# Patient Record
Sex: Male | Born: 1955 | Race: Black or African American | Hispanic: No | Marital: Married | Smoking: Former smoker
Health system: Southern US, Community
[De-identification: ages and names within clinical notes are randomized; demographics above are authoritative.]

## PROBLEM LIST (undated history)

## (undated) DIAGNOSIS — Z720 Tobacco use: Secondary | ICD-10-CM

## (undated) DIAGNOSIS — J449 Chronic obstructive pulmonary disease, unspecified: Secondary | ICD-10-CM

## (undated) HISTORY — PX: OTHER SURGICAL HISTORY: SHX169

---

## 2019-03-10 ENCOUNTER — Inpatient Hospital Stay (HOSPITAL_BASED_OUTPATIENT_CLINIC_OR_DEPARTMENT_OTHER)
Admission: EM | Admit: 2019-03-10 | Discharge: 2019-03-14 | DRG: 190 | Disposition: A | Discharge status: Home / Self Care | Payer: Medicare Other | Attending: Internal Medicine | Admitting: Internal Medicine

## 2019-03-10 ENCOUNTER — Encounter (HOSPITAL_BASED_OUTPATIENT_CLINIC_OR_DEPARTMENT_OTHER): Payer: Self-pay | Admitting: Emergency Medicine

## 2019-03-10 ENCOUNTER — Emergency Department (HOSPITAL_BASED_OUTPATIENT_CLINIC_OR_DEPARTMENT_OTHER): Payer: Medicare Other

## 2019-03-10 ENCOUNTER — Other Ambulatory Visit: Payer: Self-pay

## 2019-03-10 DIAGNOSIS — D696 Thrombocytopenia, unspecified: Secondary | ICD-10-CM | POA: Diagnosis present

## 2019-03-10 DIAGNOSIS — Z9981 Dependence on supplemental oxygen: Secondary | ICD-10-CM

## 2019-03-10 DIAGNOSIS — J9621 Acute and chronic respiratory failure with hypoxia: Secondary | ICD-10-CM | POA: Diagnosis present

## 2019-03-10 DIAGNOSIS — Z79899 Other long term (current) drug therapy: Secondary | ICD-10-CM

## 2019-03-10 DIAGNOSIS — Z87891 Personal history of nicotine dependence: Secondary | ICD-10-CM

## 2019-03-10 DIAGNOSIS — J449 Chronic obstructive pulmonary disease, unspecified: Secondary | ICD-10-CM | POA: Diagnosis not present

## 2019-03-10 DIAGNOSIS — F419 Anxiety disorder, unspecified: Secondary | ICD-10-CM | POA: Diagnosis present

## 2019-03-10 DIAGNOSIS — Z20822 Contact with and (suspected) exposure to covid-19: Secondary | ICD-10-CM | POA: Diagnosis present

## 2019-03-10 DIAGNOSIS — M549 Dorsalgia, unspecified: Secondary | ICD-10-CM | POA: Diagnosis present

## 2019-03-10 DIAGNOSIS — Z720 Tobacco use: Secondary | ICD-10-CM | POA: Diagnosis present

## 2019-03-10 DIAGNOSIS — K5900 Constipation, unspecified: Secondary | ICD-10-CM | POA: Diagnosis present

## 2019-03-10 DIAGNOSIS — D539 Nutritional anemia, unspecified: Secondary | ICD-10-CM | POA: Diagnosis present

## 2019-03-10 DIAGNOSIS — Z7951 Long term (current) use of inhaled steroids: Secondary | ICD-10-CM

## 2019-03-10 HISTORY — DX: Tobacco use: Z72.0

## 2019-03-10 HISTORY — DX: Chronic obstructive pulmonary disease, unspecified: J44.9

## 2019-03-10 LAB — CBC WITH DIFFERENTIAL/PLATELET
Abs Immature Granulocytes: 0.01 10*3/uL (ref 0.00–0.07)
Basophils Absolute: 0 10*3/uL (ref 0.0–0.1)
Basophils Relative: 0 %
Eosinophils Absolute: 0.1 10*3/uL (ref 0.0–0.5)
Eosinophils Relative: 2 %
HCT: 38.2 % — ABNORMAL LOW (ref 39.0–52.0)
Hemoglobin: 12 g/dL — ABNORMAL LOW (ref 13.0–17.0)
Immature Granulocytes: 0 %
Lymphocytes Relative: 30 %
Lymphs Abs: 1.3 10*3/uL (ref 0.7–4.0)
MCH: 32.3 pg (ref 26.0–34.0)
MCHC: 31.4 g/dL (ref 30.0–36.0)
MCV: 103 fL — ABNORMAL HIGH (ref 80.0–100.0)
Monocytes Absolute: 0.4 10*3/uL (ref 0.1–1.0)
Monocytes Relative: 9 %
Neutro Abs: 2.5 10*3/uL (ref 1.7–7.7)
Neutrophils Relative %: 59 %
Platelets: 147 10*3/uL — ABNORMAL LOW (ref 150–400)
RBC: 3.71 MIL/uL — ABNORMAL LOW (ref 4.22–5.81)
RDW: 13.1 % (ref 11.5–15.5)
WBC: 4.2 10*3/uL (ref 4.0–10.5)
nRBC: 0 % (ref 0.0–0.2)

## 2019-03-10 LAB — BASIC METABOLIC PANEL
Anion gap: 5 (ref 5–15)
BUN: 19 mg/dL (ref 8–23)
CO2: 37 mmol/L — ABNORMAL HIGH (ref 22–32)
Calcium: 9.2 mg/dL (ref 8.9–10.3)
Chloride: 102 mmol/L (ref 98–111)
Creatinine, Ser: 0.7 mg/dL (ref 0.61–1.24)
GFR calc Af Amer: >60 mL/min (ref >60)
GFR calc non Af Amer: >60 mL/min (ref >60)
Glucose, Bld: 79 mg/dL (ref 70–99)
Potassium: 3.6 mmol/L (ref 3.5–5.1)
Sodium: 144 mmol/L (ref 135–145)

## 2019-03-10 LAB — SARS CORONAVIRUS 2 BY RT PCR (HOSPITAL ORDER, PERFORMED IN ~~LOC~~ HOSPITAL LAB): SARS Coronavirus 2: NEGATIVE

## 2019-03-10 MED ORDER — ACETAMINOPHEN 500 MG PO TABS
1000.0000 mg | ORAL_TABLET | Freq: Once | ORAL | Status: AC
  Administered 2019-03-10: 1000 mg via ORAL
  Filled 2019-03-10: qty 2

## 2019-03-10 MED ORDER — DIAZEPAM 2 MG PO TABS
2.0000 mg | ORAL_TABLET | Freq: Once | ORAL | Status: AC
  Administered 2019-03-10: 2 mg via ORAL
  Filled 2019-03-10: qty 1

## 2019-03-10 MED ORDER — DIAZEPAM 5 MG PO TABS
5.0000 mg | ORAL_TABLET | Freq: Once | ORAL | Status: AC
  Administered 2019-03-10: 5 mg via ORAL
  Filled 2019-03-10: qty 1

## 2019-03-10 NOTE — Plan of Care (Addendum)
64 year old gentleman who originally from [place] with history of severe COPD on 4 L of oxygen at baseline apparently was notified recently that his mother was dying he traveled rapidly to [place] and forgot his home oxygen concentrator he bought some oxygen tanks with him but they ran out he presented to emergency department severely hypoxic where he was started on oxygen.  multiple extensive attempts were done throughout the day to try to arrange for home oxygen concentrator unfortunately they were unsuccessful patient has to be admitted for hypoxia in the setting of running out of home O2. Basic labs unremarkable chest x-ray showing COPD.  Covid currently pending  Accepted as obs  Callan Yontz 8:01 PM

## 2019-03-10 NOTE — ED Notes (Signed)
1600- Spoke to care Production designer, theatre/television/film, she will follow up with patients wife and oxygen company in [place].

## 2019-03-10 NOTE — ED Notes (Signed)
Lean cuisine, applesauce and graham crackers provided.  Encouraged to call for assistance as needed.

## 2019-03-10 NOTE — Progress Notes (Signed)
SATURATION QUALIFICATIONS: (This note is used to comply with regulatory documentation for home oxygen)  Patient Saturations on Room Air at Rest = 80%  Patient Saturations on Room Air while Ambulating = 71 %  Patient Saturations on 2 Liters of oxygen while Ambulating = 93 %  Please briefly explain why patient needs home oxygen: COPD  Per Cleatrice Burke RN

## 2019-03-10 NOTE — ED Notes (Signed)
Pt ate lunch  No distress noted

## 2019-03-10 NOTE — ED Notes (Signed)
1311- Faxed information to [facility] with updated insurance information.  Called [facility], they have not received fax yet.

## 2019-03-10 NOTE — ED Notes (Signed)
1330-Called and spoke to [facility] regarding referral.  They havent received it yet.    1349-Refaxed referral to [facility].  Transmission log shows it went through.    1410-Called [facility] regarding fax.  They havent received referral yet, probably due to staff at lunch.  Informed to wait and call again in one hour.

## 2019-03-10 NOTE — ED Notes (Addendum)
1243-Faxed over information to [facility] for oxygen therapy.  Fax went through ok.

## 2019-03-10 NOTE — ED Notes (Addendum)
Called and spoke to [facility].  They still have not received fax.  Number given for [place]:  [phone].  Called number x 3.  Rings for a minute then hangs up

## 2019-03-10 NOTE — ED Notes (Addendum)
1800-  Numerous attempts have been made to provide an oxygen concentrator at home since 1245 today.  Unable to provide at this time by patients oxygen company until Monday.  Since pt is staying at a hotel, it is not safe to discharge pt on oxygen tanks as they would need to be switched out every 3-4 hours.  Pt is agreeable to admission at this time.  EDP notified.

## 2019-03-10 NOTE — ED Notes (Signed)
Assisted Pt in calling the two phone numbers of family members a couple of times and no one answered. RN Doreatha Martin has been informed of Pt wanting to eat.

## 2019-03-10 NOTE — Progress Notes (Signed)
TOC CM spoke to pt's wife, Steven Kerr. States she contacted pt's DME supplier, [employer] and they are shipping him a portable plug in oxygen so he can travel back to [place]. The device is scheduled to arrive on Monday. I did contact [employer] rep, Chalmers Cater. And they are not able to provide or switch out patient oxygen as he is contracted with [employer]. 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 Wife states pt is in his 2nd year. Isidoro Donning RN CCM, [facility] ED TOC CM [phone]

## 2019-03-10 NOTE — ED Notes (Signed)
1700-  Spoke to care Production designer, theatre/television/film.  Unable to get oxygen concentrator until Monday. Other options discussed.  Unable to provide continuous oxygen at hotel room.

## 2019-03-10 NOTE — ED Notes (Addendum)
SATURATION QUALIFICATIONS: (This note is used to comply with regulatory documentation for home oxygen)  Patient Saturations on Room Air at Rest = 80%  Patient Saturations on Room Air while Ambulating = 71 %  Patient Saturations on 2 Liters of oxygen At rest = 93 %  Please briefly explain why patient needs home oxygen:  COPD Cleatrice Burke RN

## 2019-03-10 NOTE — ED Notes (Signed)
1310-Spoke to wife and gave update

## 2019-03-10 NOTE — ED Notes (Signed)
Pt informed twice that our Charge RN Asencion Islam is speaking with his wife on the phone and she will give him an update after she is done about his oxygen tank.

## 2019-03-10 NOTE — ED Notes (Signed)
[address] [place], [place] [address]

## 2019-03-10 NOTE — ED Triage Notes (Signed)
Here c/o SOB.  Noted to be labored.  Initial O2 sat is 71% on RA.  Reports being here from [place] and he ran out of oxygen.

## 2019-03-10 NOTE — ED Provider Notes (Addendum)
[facility] EMERGENCY DEPARTMENT Provider Note   CSN: [id number] Arrival date & time: 03/10/19  1059     History Chief Complaint  Patient presents with  . Shortness of Breath    Oxygen tank ran out    Steven Kerr is a 64 y.o. male.  HPI Patient reports he had to travel very suddenly last night leaving [place] to come to [place] to see his mother.  She is in the hospital sick and apparently dying.  He reports he left late last night and was unable to accommodate his oxygen tank.  He has run out of oxygen and becomes severely short of breath.  He reports he chronically needs the oxygen for COPD.  He denies that he has been having any problems with fevers, chills, productive cough or chest pain.  Denies has been having any issues with swelling of the legs.  He reports he is also having anxiety because of the stressful situation with his mother and some diffuse central back pain from lying in the stretcher.  He otherwise denies any immediate complaints.  He is eating without difficulty.  No GI or GU symptoms.    Past Medical History:  Diagnosis Date  . COPD (chronic obstructive pulmonary disease) (HCC)     There are no problems to display for this patient.   History reviewed. No pertinent surgical history.     No family history on file.  Social History   Tobacco Use  . Smoking status: Former Games developer  . Smokeless tobacco: Never Used  Substance Use Topics  . Alcohol use: Never  . Drug use: Never    Home Medications Prior to Admission medications   Medication Sig Start Date End Date Taking? Authorizing Provider  albuterol (VENTOLIN HFA) 108 (90 Base) MCG/ACT inhaler Inhale 2 puffs into the lungs every 6 (six) hours as needed for wheezing or shortness of breath.   Yes [provider]  OXYGEN Inhale 2 L/min into the lungs continuous.   Yes [provider]  tiotropium (SPIRIVA) 18 MCG inhalation capsule Place 18 mcg into inhaler and inhale daily.    Yes [provider]    Allergies    Patient has no known allergies.  Review of Systems   Review of Systems 10 Systems reviewed and are negative for acute change except as noted in the HPI.  Physical Exam Updated Vital Signs BP 118/76   Pulse (!) 109   Temp 97.7 F (36.5 C) (Oral)   Resp (!) 27   Ht 6\' 1"  (1.854 m)   Wt 61.2 kg   SpO2 97%   BMI 17.81 kg/m   Physical Exam Constitutional:      Comments: Patient is very thin.  He is on nasal cannula oxygen.  No respiratory distress.  Mental status clear.  Sitting eating a meal tray without respiratory distress.  HENT:     Head: Normocephalic and atraumatic.  Eyes:     Extraocular Movements: Extraocular movements intact.  Cardiovascular:     Rate and Rhythm: Normal rate and regular rhythm.  Pulmonary:     Comments: No respiratory distress at rest.  Breath sounds are very soft throughout the lung fields. Abdominal:     General: There is no distension.     Palpations: Abdomen is soft.     Tenderness: There is no abdominal tenderness.  Musculoskeletal:        General: No swelling or tenderness. Normal range of motion.  Skin:    General:  Skin is warm and dry.  Neurological:     General: No focal deficit present.     Mental Status: He is oriented to person, place, and time.     Coordination: Coordination normal.     ED Results / Procedures / Treatments   Labs (all labs ordered are listed, but only abnormal results are displayed) Labs Reviewed - No data to display  EKG EKG Interpretation  Date/Time:  Friday March 10 2019 11:11:06 EST Ventricular Rate:  114 PR Interval:    QRS Duration: 78 QT Interval:  326 QTC Calculation: 453 R Axis:   84 Text Interpretation: Sinus tachycardia Atrial premature complex Anterior infarct, old Nonspecific T abnormalities, lateral leads agree, no STEMI, no old comparison Confirmed by Charlesetta Shanks [phone]) on 03/10/2019 2:21:30 PM   Radiology No results  found.  Procedures Procedures (including critical care time)  Medications Ordered in ED Medications  diazepam (VALIUM) tablet 2 mg (2 mg Oral Given 03/10/19 1253)  acetaminophen (TYLENOL) tablet 1,000 mg (1,000 mg Oral Given 03/10/19 1253)    ED Course  I have reviewed the triage vital signs and the nursing notes.  Pertinent labs & imaging results that were available during my care of the patient were reviewed by me and considered in my medical decision making (see chart for details).  Clinical Course as of Mar 12 1706  Fri Mar 10, 2019  1534 Patient signed out to me by Dr. Christia Reading.  Briefly 64 yo male w/ hx of COPD on 4L [place] at baseline presenting to ED because his portable oxygen tanks ran out.  He is visiting here from [place] because his mother is in the hospital on hospice.  He brought a few portable tanks but not his oxygen condenser machine and his tanks ran out this morning.  He was initially hypoxic on arrival but this corrected on his baseline [place] amount.  No other acute clinical concerns.  We are attempting to find him replacement oxygen.   [MT]  6720 Patient's O2 company contacted and they will be able to ship him new supplies on Monday.  Unfortunately we have no available option to provide him long-term home O2 from the emergency department.  He will need observation admission for oxygen   [MT]  1958 To admit to hospitalist, Dr Roel Cluck   [MT]    Clinical Course User Index [MT] Langston Masker Carola Rhine, MD   MDM Rules/Calculators/A&P                     Patient has no acute complaints however he has run out of oxygen on which he is dependent.  He is denying fever, cough, atypical shortness of breath or chest pain.  He was unable to bring adequate oxygen due to transportation issues coming from [place].  He also reports he needs some Valium because he is very upset about his mother's condition while he has to be waiting in the emergency department to get more oxygen before he can be with  his family.  We are trying to get the patient the oxygen therapy on outpatient basis that he needs.  He has been provided with a meal tray, supplemental oxygen, Tylenol and Valium.  He is comfortable in appearance and awaiting means to obtain adequate oxygen refills while here in [place]. Final Clinical Impression(s) / ED Diagnoses Final diagnoses:  COPD without exacerbation ([place])  Oxygen dependent    Rx / DC Orders ED Discharge Orders    None  Arby Barrette, MD 03/10/19 1425    Arby Barrette, MD 03/13/19 [phone]

## 2019-03-10 NOTE — ED Notes (Signed)
Pt on monitor 

## 2019-03-11 ENCOUNTER — Encounter (HOSPITAL_COMMUNITY): Payer: Self-pay | Admitting: Internal Medicine

## 2019-03-11 DIAGNOSIS — F419 Anxiety disorder, unspecified: Secondary | ICD-10-CM | POA: Diagnosis present

## 2019-03-11 DIAGNOSIS — M549 Dorsalgia, unspecified: Secondary | ICD-10-CM | POA: Diagnosis present

## 2019-03-11 DIAGNOSIS — J9621 Acute and chronic respiratory failure with hypoxia: Secondary | ICD-10-CM

## 2019-03-11 DIAGNOSIS — D696 Thrombocytopenia, unspecified: Secondary | ICD-10-CM | POA: Diagnosis present

## 2019-03-11 DIAGNOSIS — J449 Chronic obstructive pulmonary disease, unspecified: Secondary | ICD-10-CM | POA: Diagnosis present

## 2019-03-11 DIAGNOSIS — D539 Nutritional anemia, unspecified: Secondary | ICD-10-CM | POA: Diagnosis present

## 2019-03-11 DIAGNOSIS — Z9981 Dependence on supplemental oxygen: Secondary | ICD-10-CM | POA: Diagnosis present

## 2019-03-11 DIAGNOSIS — Z720 Tobacco use: Secondary | ICD-10-CM

## 2019-03-11 DIAGNOSIS — Z7951 Long term (current) use of inhaled steroids: Secondary | ICD-10-CM | POA: Diagnosis not present

## 2019-03-11 DIAGNOSIS — Z20822 Contact with and (suspected) exposure to covid-19: Secondary | ICD-10-CM | POA: Diagnosis present

## 2019-03-11 DIAGNOSIS — K5900 Constipation, unspecified: Secondary | ICD-10-CM | POA: Diagnosis present

## 2019-03-11 DIAGNOSIS — Z87891 Personal history of nicotine dependence: Secondary | ICD-10-CM | POA: Diagnosis not present

## 2019-03-11 DIAGNOSIS — Z79899 Other long term (current) drug therapy: Secondary | ICD-10-CM | POA: Diagnosis not present

## 2019-03-11 LAB — COMPREHENSIVE METABOLIC PANEL WITH GFR
ALT: 26 U/L (ref 0–44)
AST: 30 U/L (ref 15–41)
Albumin: 3.9 g/dL (ref 3.5–5.0)
Alkaline Phosphatase: 58 U/L (ref 38–126)
Anion gap: 8 (ref 5–15)
BUN: 13 mg/dL (ref 8–23)
CO2: 33 mmol/L — ABNORMAL HIGH (ref 22–32)
Calcium: 9.1 mg/dL (ref 8.9–10.3)
Chloride: 102 mmol/L (ref 98–111)
Creatinine, Ser: 0.85 mg/dL (ref 0.61–1.24)
GFR calc Af Amer: >60 mL/min
GFR calc non Af Amer: >60 mL/min
Glucose, Bld: 116 mg/dL — ABNORMAL HIGH (ref 70–99)
Potassium: 4.1 mmol/L (ref 3.5–5.1)
Sodium: 143 mmol/L (ref 135–145)
Total Bilirubin: 1 mg/dL (ref 0.3–1.2)
Total Protein: 6.4 g/dL — ABNORMAL LOW (ref 6.5–8.1)

## 2019-03-11 LAB — HIV ANTIBODY (ROUTINE TESTING W REFLEX): HIV Screen 4th Generation wRfx: NONREACTIVE

## 2019-03-11 LAB — VITAMIN B12: Vitamin B-12: 826 pg/mL (ref 180–914)

## 2019-03-11 MED ORDER — DIAZEPAM 5 MG PO TABS
5.0000 mg | ORAL_TABLET | Freq: Two times a day (BID) | ORAL | Status: DC | PRN
  Administered 2019-03-11 – 2019-03-12 (×4): 5 mg via ORAL
  Filled 2019-03-11 (×4): qty 1

## 2019-03-11 MED ORDER — BISACODYL 5 MG PO TBEC
5.0000 mg | DELAYED_RELEASE_TABLET | Freq: Every day | ORAL | Status: DC | PRN
  Administered 2019-03-12 – 2019-03-14 (×3): 5 mg via ORAL
  Filled 2019-03-11 (×3): qty 1

## 2019-03-11 MED ORDER — ACETAMINOPHEN 650 MG RE SUPP: 650.0000 mg | Freq: Four times a day (QID) | RECTAL | Status: DC | PRN

## 2019-03-11 MED ORDER — ONDANSETRON HCL 4 MG/2ML IJ SOLN: 4.0000 mg | Freq: Four times a day (QID) | INTRAMUSCULAR | Status: DC | PRN

## 2019-03-11 MED ORDER — NICOTINE 21 MG/24HR TD PT24
21.0000 mg | MEDICATED_PATCH | Freq: Every day | TRANSDERMAL | Status: DC
  Administered 2019-03-11 – 2019-03-14 (×4): 21 mg via TRANSDERMAL
  Filled 2019-03-11 (×4): qty 1

## 2019-03-11 MED ORDER — ALBUTEROL SULFATE (2.5 MG/3ML) 0.083% IN NEBU
2.5000 mg | INHALATION_SOLUTION | Freq: Four times a day (QID) | RESPIRATORY_TRACT | Status: DC | PRN
  Administered 2019-03-11: 2.5 mg via RESPIRATORY_TRACT
  Filled 2019-03-11 (×2): qty 3

## 2019-03-11 MED ORDER — ONDANSETRON HCL 4 MG PO TABS: 4.0000 mg | ORAL_TABLET | Freq: Four times a day (QID) | ORAL | Status: DC | PRN

## 2019-03-11 MED ORDER — WHITE PETROLATUM EX OINT
TOPICAL_OINTMENT | CUTANEOUS | Status: AC
  Filled 2019-03-11: qty 28.35

## 2019-03-11 MED ORDER — KETOROLAC TROMETHAMINE 30 MG/ML IJ SOLN
30.0000 mg | Freq: Four times a day (QID) | INTRAMUSCULAR | Status: DC | PRN
  Administered 2019-03-11 – 2019-03-13 (×6): 30 mg via INTRAVENOUS
  Filled 2019-03-11 (×7): qty 1

## 2019-03-11 MED ORDER — ACETAMINOPHEN 325 MG PO TABS
650.0000 mg | ORAL_TABLET | Freq: Four times a day (QID) | ORAL | Status: DC | PRN
  Administered 2019-03-11 – 2019-03-14 (×6): 650 mg via ORAL
  Filled 2019-03-11 (×6): qty 2

## 2019-03-11 MED ORDER — UMECLIDINIUM BROMIDE 62.5 MCG/INH IN AEPB
1.0000 | INHALATION_SPRAY | Freq: Every day | RESPIRATORY_TRACT | Status: DC
  Administered 2019-03-11 – 2019-03-14 (×4): 1 via RESPIRATORY_TRACT
  Filled 2019-03-11 (×2): qty 7

## 2019-03-11 MED ORDER — TIOTROPIUM BROMIDE MONOHYDRATE 18 MCG IN CAPS: 18.0000 ug | ORAL_CAPSULE | Freq: Every day | RESPIRATORY_TRACT | Status: DC

## 2019-03-11 MED ORDER — DIAZEPAM 5 MG PO TABS: 5.0000 mg | ORAL_TABLET | Freq: Two times a day (BID) | ORAL | Status: DC

## 2019-03-11 MED ORDER — ENOXAPARIN SODIUM 40 MG/0.4ML ~~LOC~~ SOLN
40.0000 mg | SUBCUTANEOUS | Status: DC
  Administered 2019-03-11 – 2019-03-14 (×4): 40 mg via SUBCUTANEOUS
  Filled 2019-03-11 (×4): qty 0.4

## 2019-03-11 MED ORDER — TRAZODONE HCL 50 MG PO TABS
50.0000 mg | ORAL_TABLET | Freq: Once | ORAL | Status: AC
  Administered 2019-03-11: 50 mg via ORAL
  Filled 2019-03-11: qty 1

## 2019-03-11 NOTE — Progress Notes (Signed)
[facility]. Alert,oriented x4. O2 via nasal cannula @2L /min . No signs of distress

## 2019-03-11 NOTE — Progress Notes (Signed)
Pt is here from [place] to see his dying mother who happens to be in [phone]!  He ran out of oxygen in his tank and went to [facility].  He was transferred here to 6N04 with [facility].  I wheeled him over to see his mom. Pt hasnt eaten for 20 hours so ordered meals for him.

## 2019-03-11 NOTE — ED Notes (Signed)
ED TO INPATIENT HANDOFF REPORT  ED Nurse Name and Phone #: Normajean Baxter [phone]  S Name/Age/Gender Steven Kerr 64 y.o. male Room/Bed: MH12/MH12  Code Status   Code Status: Not on file  Home/SNF/Other Home Patient oriented to: self, place, time and situation Is this baseline? Yes   Triage Complete: Triage complete  Chief Complaint Hypoxia [R09.02]  Triage Note Here c/o SOB.  Noted to be labored.  Initial O2 sat is 71% on RA.  Reports being here from [place] and he ran out of oxygen.    Allergies No Known Allergies  Level of Care/Admitting Diagnosis ED Disposition    ED Disposition Condition Comment   Admit  Hospital Area: [place] [[id number]]  Level of Care: Med-Surg [16]  I expect the patient will be discharged within 24 hours: No (not a candidate for 5C-Observation unit)  Covid Evaluation: Confirmed COVID Negative  Date Laboratory Confirmed COVID Negative: 03/10/2019  Diagnosis: Hypoxia [010272]  Admitting Physician: [place], [place]  Attending Physician: Toy Baker [[id number]]       B Medical/Surgery History Past Medical History:  Diagnosis Date  . COPD (chronic obstructive pulmonary disease) ([place])    History reviewed. No pertinent surgical history.   A IV Location/Drains/Wounds Patient Lines/Drains/Airways Status   Active Line/Drains/Airways    Name:   Placement date:   Placement time:   Site:   Days:   Peripheral IV 03/10/19 Right Antecubital   03/10/19    1859    Antecubital   1          Intake/Output Last 24 hours No intake or output data in the 24 hours ending 03/11/19 0725  Labs/Imaging Results for orders placed or performed during the hospital encounter of 03/10/19 (from the past 48 hour(s))  SARS Coronavirus 2 by RT PCR (hospital order, performed in [place] hospital lab) Nasopharyngeal Nasopharyngeal Swab     Status: None   Collection Time: 03/10/19  6:59 PM   Specimen: Nasopharyngeal Swab  Result Value Ref  Range   SARS Coronavirus 2 NEGATIVE NEGATIVE    Comment: Performed at [facility], [address] [place], [place] [address]  CBC with Differential     Status: Abnormal   Collection Time: 03/10/19  6:59 PM  Result Value Ref Range   WBC 4.2 4.0 - 10.5 K/uL   RBC 3.71 (L) 4.22 - 5.81 MIL/uL   Hemoglobin 12.0 (L) 13.0 - 17.0 g/dL   HCT 38.2 (L) 39.0 - 52.0 %   MCV 103.0 (H) 80.0 - 100.0 fL   MCH 32.3 26.0 - 34.0 pg   MCHC 31.4 30.0 - 36.0 g/dL   RDW 13.1 11.5 - 15.5 %   Platelets 147 (L) 150 - 400 K/uL   nRBC 0.0 0.0 - 0.2 %   Neutrophils Relative % 59 %   Neutro Abs 2.5 1.7 - 7.7 K/uL   Lymphocytes Relative 30 %   Lymphs Abs 1.3 0.7 - 4.0 K/uL   Monocytes Relative 9 %   Monocytes Absolute 0.4 0.1 - 1.0 K/uL   Eosinophils Relative 2 %   Eosinophils Absolute 0.1 0.0 - 0.5 K/uL   Basophils Relative 0 %   Basophils Absolute 0.0 0.0 - 0.1 K/uL   Immature Granulocytes 0 %   Abs Immature Granulocytes 0.01 0.00 - 0.07 K/uL    Comment: Performed at [facility], [address] [place], [place] [address]  Basic metabolic panel     Status: Abnormal  Collection Time: 03/10/19  6:59 PM  Result Value Ref Range   Sodium 144 135 - 145 mmol/L   Potassium 3.6 3.5 - 5.1 mmol/L   Chloride 102 98 - 111 mmol/L   CO2 37 (H) 22 - 32 mmol/L   Glucose, Bld 79 70 - 99 mg/dL   BUN 19 8 - 23 mg/dL   Creatinine, Ser 6.43 0.61 - 1.24 mg/dL   Calcium 9.2 8.9 - 32.9 mg/dL   GFR calc non Af Amer >60 >60 mL/min   GFR calc Af Amer >60 >60 mL/min   Anion gap 5 5 - 15    Comment: Performed at [facility], [address] [place], [place] [address]   DG Chest Portable 1 View  Result Date: 03/10/2019 CLINICAL DATA:  64 year old male with shortness of breath. EXAM: PORTABLE CHEST 1 VIEW COMPARISON:  None. FINDINGS: There is severe emphysema with hyperexpansion of the lungs. No focal consolidation, pleural effusion, or pneumothorax. The cardiac silhouette is within normal  limits. No acute osseous pathology. IMPRESSION: 1. No acute cardiopulmonary process. 2. Severe emphysema. Electronically Signed   By: Elgie Collard M.D.   On: 03/10/2019 18:21    Pending Labs Unresulted Labs (From admission, onward)   None      Vitals/Pain Today's Vitals   03/11/19 0500 03/11/19 0530 03/11/19 0545 03/11/19 0600  BP: 105/74 110/85  139/87  Pulse: 84 85 85   Resp: (!) 21 18 (!) 22 (!) 22  Temp:      TempSrc:      SpO2: 100% 100% 96%   Weight:      Height:      PainSc: Asleep   Asleep    Isolation Precautions No active isolations  Medications Medications  diazepam (VALIUM) tablet 2 mg (2 mg Oral Given 03/10/19 1253)  acetaminophen (TYLENOL) tablet 1,000 mg (1,000 mg Oral Given 03/10/19 1253)  diazepam (VALIUM) tablet 2 mg (2 mg Oral Given 03/10/19 1851)  diazepam (VALIUM) tablet 5 mg (5 mg Oral Given 03/10/19 2202)    Mobility walks Low fall risk   Focused Assessments .   R Recommendations: See Admitting Provider Note  Report given to:   Additional Notes:

## 2019-03-11 NOTE — H&P (Addendum)
History and Physical    Steven Kerr LGX:[id number] DOB: November 05, 1955 DOA: 03/10/2019  Referring MD/NP/PA: Leonie Man, MD PCP: System, Pcp Not In  Patient coming from: Transfer from [facility]  Chief Complaint: Shortness of breath  I have personally briefly reviewed patient's old medical records in [place]   HPI: Steven Kerr is a 64 y.o. male with medical history significant of COPD on 2 L of oxygen at baseline and tobacco abuse.  He presents with complaints of shortness of breath.  Patient had traveled from [place] here after being told that his mother was dying.  He was unable to bring enough oxygen canisters with him and ran out of his medications like Spiriva and Valium.  Associated symptoms include anxiousness.  If everything is going on he has been hyperventilating and requiring 4 L of nasal cannula oxygen to maintain his oxygenation.  Denies having any significant fever, cough, chest pain, nausea, vomiting, or diarrhea.  Patient smoked for more than 20 years, but reported just quitting last month.  ED Course: Upon admission to the emergency department patient was noted to be afebrile, tachycardic, and tachypneic with O2 saturations as low as 71% on room air.  O2 saturations improved once placed back on home 4 L.  Labs significant for hemoglobin 12, platelet count 147, and CO2 37.  COVID-19 screening was negative.  Chest x-ray noted severe emphysema without signs of infiltrate.  Patient was given Valium and Tylenol.  [facility] called to admit as they were unable to set the patient up with home oxygen at med center.  Review of Systems  Constitutional: Negative for fever and weight loss.  HENT: Negative for ear pain.   Eyes: Negative for discharge.  Respiratory: Positive for shortness of breath. Negative for cough.   Cardiovascular: Negative for chest pain, leg swelling and PND.  Gastrointestinal: Negative for abdominal pain, nausea and vomiting.  Genitourinary: Negative for dysuria and  hematuria.  Musculoskeletal: Negative for joint pain and myalgias.  Skin: Negative for itching.  Neurological: Negative for focal weakness and loss of consciousness.  Psychiatric/Behavioral: Negative for suicidal ideas. The patient is nervous/anxious.     Past Medical History:  Diagnosis Date  . COPD (chronic obstructive pulmonary disease) ([place])   . Tobacco abuse     Past Surgical History:  Procedure Laterality Date  . Multiple back surgery       reports that he has quit smoking. He has never used smokeless tobacco. He reports that he does not drink alcohol or use drugs.  No Known Allergies  Family History  Problem Relation Age of Onset  . Hypertension Mother   . Hyperlipidemia Mother   . Dementia Mother     Prior to Admission medications   Medication Sig Start Date End Date Taking? Authorizing Provider  albuterol (VENTOLIN HFA) 108 (90 Base) MCG/ACT inhaler Inhale 2 puffs into the lungs every 6 (six) hours as needed for wheezing or shortness of breath.   Yes [provider]  OXYGEN Inhale 2 L/min into the lungs continuous.   Yes [provider]  tiotropium (SPIRIVA) 18 MCG inhalation capsule Place 18 mcg into inhaler and inhale daily.   Yes [provider]    Physical Exam:  Constitutional: Thin elderly appearing male with sitting in a wheelchair in his mother room across the hall from his room Vitals:   03/11/19 0545 03/11/19 0600 03/11/19 0730 03/11/19 0836  BP:  139/87 126/88 (!) 141/101  Pulse: 85  86 95  Resp: Marland Kitchen)  22 (!) 22 (!) 22 15  Temp:    (!) 97.5 F (36.4 C)  TempSrc:    Oral  SpO2: 96%  97% 100%  Weight:    59 kg  Height:    6\' 1"  (1.854 m)   Eyes: PERRL, lids and conjunctivae normal ENMT: Mucous membranes are moist. Posterior pharynx clear of any exudate or lesions.   Neck: normal, supple, no masses, no thyromegaly Respiratory: Decreased overall aeration without expiratory wheeze appreciated.  Currently on nasal cannula  oxygen at 2 L on nasal cannula oxygen able to can talk in complete sentences. Cardiovascular: Regular rate and rhythm, no murmurs / rubs / gallops. No extremity edema. 2+ pedal pulses. No carotid bruits.  Abdomen: no tenderness, no masses palpated. No hepatosplenomegaly. Bowel sounds positive.  Musculoskeletal: no clubbing / cyanosis. No joint deformity upper and lower extremities. Good ROM, no contractures. Normal muscle tone.  Skin: no rashes, lesions, ulcers. No induration Neurologic: CN 2-12 grossly intact. Sensation intact, DTR normal. Strength 5/5 in all 4.  Psychiatric: Normal judgment and insight. Alert and oriented x 3.  Anxious mood.     Labs on Admission: I have personally reviewed following labs and imaging studies  CBC: Recent Labs  Lab 03/10/19 1859  WBC 4.2  NEUTROABS 2.5  HGB 12.0*  HCT 38.2*  MCV 103.0*  PLT 147*   Basic Metabolic Panel: Recent Labs  Lab 03/10/19 1859  NA 144  K 3.6  CL 102  CO2 37*  GLUCOSE 79  BUN 19  CREATININE 0.70  CALCIUM 9.2   GFR: Estimated Creatinine Clearance: 79.9 mL/min (by C-G formula based on SCr of 0.7 mg/dL). Liver Function Tests: No results for input(s): AST, ALT, ALKPHOS, BILITOT, PROT, ALBUMIN in the last 168 hours. No results for input(s): LIPASE, AMYLASE in the last 168 hours. No results for input(s): AMMONIA in the last 168 hours. Coagulation Profile: No results for input(s): INR, PROTIME in the last 168 hours. Cardiac Enzymes: No results for input(s): CKTOTAL, CKMB, CKMBINDEX, TROPONINI in the last 168 hours. BNP (last 3 results) No results for input(s): PROBNP in the last 8760 hours. HbA1C: No results for input(s): HGBA1C in the last 72 hours. CBG: No results for input(s): GLUCAP in the last 168 hours. Lipid Profile: No results for input(s): CHOL, HDL, LDLCALC, TRIG, CHOLHDL, LDLDIRECT in the last 72 hours. Thyroid Function Tests: No results for input(s): TSH, T4TOTAL, FREET4, T3FREE, THYROIDAB in the  last 72 hours. Anemia Panel: No results for input(s): VITAMINB12, FOLATE, FERRITIN, TIBC, IRON, RETICCTPCT in the last 72 hours. Urine analysis: No results found for: COLORURINE, APPEARANCEUR, LABSPEC, PHURINE, GLUCOSEU, HGBUR, BILIRUBINUR, KETONESUR, PROTEINUR, UROBILINOGEN, NITRITE, LEUKOCYTESUR Sepsis Labs: Recent Results (from the past 240 hour(s))  SARS Coronavirus 2 by RT PCR (hospital order, performed in [facility] hospital lab) Nasopharyngeal Nasopharyngeal Swab     Status: None   Collection Time: 03/10/19  6:59 PM   Specimen: Nasopharyngeal Swab  Result Value Ref Range Status   SARS Coronavirus 2 NEGATIVE NEGATIVE Final    Comment: Performed at [facility], [address] [place], [place]     Radiological Exams on Admission: DG Chest Portable 1 View  Result Date: 03/10/2019 CLINICAL DATA:  64 year old male with shortness of breath. EXAM: PORTABLE CHEST 1 VIEW COMPARISON:  None. FINDINGS: There is severe emphysema with hyperexpansion of the lungs. No focal consolidation, pleural effusion, or pneumothorax. The cardiac silhouette is within normal limits. No acute osseous pathology. IMPRESSION: 1.  No acute cardiopulmonary process. 2. Severe emphysema. Electronically Signed   By: Elgie Collard M.D.   On: 03/10/2019 18:21    EKG: Independently reviewed.  Sinus tachycardia 114 bpm  Assessment/Plan Acute on chronic chronic respiratory failure with hypoxia, COPD/emphysema: Patient presents with complaints of shortness of breath after running out of oxygen while traveling from [place].  Normally patient on 2 L clean.  Chest x-ray noted severe emphysema.  Patient has poor overall air movement, but does not appear to be having an acute flare.  Social work consulted and noted that it will likely be 1/25 before able to arrange for oxygen to be delivered to patient. -Admit to a MedSurg bed -Continuous nasal cannula oxygen to maintain O2 saturation -Transitions of care  consult for oxygen  -Continue pharmacy substitution of Spiriva and albuterol inhaler as needed  Macrocytic anemia: Hemoglobin 12 on admission with elevated MCV of 103.  Question possibility of nutrient deficiency. -Check vitamin B12 and folate  Thrombocytopenia: Acute.  Platelet count 147 on admission. -Check CMP  Anxiety: Patient reports being very anxious with everything that is going on with his mother currently.  Reports taking Valium twice daily at home as needed for anxiety. -Continue Valium as needed twice daily  Tobacco abuse: Patient reports quitting smoking tobacco approximately 1 month ago. -Continue to encourage cessation of tobacco use  DVT prophylaxis: Lovenox Code Status: Full Family Communication: No family requested to be updated Disposition Plan: Likely discharge home in 1 day. Consults called: None Admission status: Inpatient   Clydie Braun MD [facility] Pager [phone]   If 7PM-7AM, please contact night-coverage [email or web address] Password [email or web address]  03/11/2019, 10:50 AM

## 2019-03-12 DIAGNOSIS — J449 Chronic obstructive pulmonary disease, unspecified: Secondary | ICD-10-CM

## 2019-03-12 DIAGNOSIS — Z9981 Dependence on supplemental oxygen: Secondary | ICD-10-CM

## 2019-03-12 MED ORDER — TRAZODONE HCL 50 MG PO TABS
50.0000 mg | ORAL_TABLET | Freq: Every evening | ORAL | Status: DC | PRN
  Administered 2019-03-12 – 2019-03-13 (×2): 50 mg via ORAL
  Filled 2019-03-12 (×2): qty 1

## 2019-03-12 NOTE — Plan of Care (Signed)

## 2019-03-12 NOTE — Progress Notes (Signed)
PROGRESS NOTE    Steven Kerr  EGB:[id number] DOB: [birth year] DOA: 03/10/2019 PCP: System, Pcp Not In   Brief Narrative:   Steven Kerr is a 64 y.o. male with medical history significant of COPD on 2 L of oxygen at baseline and tobacco abuse.  He presents with complaints of shortness of breath.  Patient had traveled from [place] here after being told that his mother was dying.  He was unable to bring enough oxygen canisters with him and ran out of his medications like Spiriva and Valium.  Upon admission to the emergency department patient was noted to be afebrile, tachycardic, and tachypneic with O2 saturations as low as 71% on room air.  O2 saturations improved once placed back on home 4 L.  Labs significant for hemoglobin 12, platelet count 147, and CO2 37.  COVID-19 screening was negative.  Chest x-ray noted severe emphysema without signs of infiltrate.  Patient was given Valium and Tylenol.  [facility] called to admit as they were unable to set the patient up with home oxygen at med center.   Assessment & Plan:   Principal Problem:   Acute on chronic respiratory failure with hypoxia (HCC) Active Problems:   Macrocytic anemia   Thrombocytopenia (HCC)   Anxiety   Tobacco abuse  Acute on chronic chronic respiratory failure with hypoxia, COPD/emphysema: Patient presents with complaints of shortness of breath after running out of oxygen while traveling from [place].  Normally patient on 2 L clean.  Chest x-ray noted severe emphysema.  Patient has poor overall air movement, but does not appear to be having an acute flare.  Social work consulted and noted that it will likely be 1/25 before able to arrange for oxygen to be delivered to patient. -Continuous nasal cannula oxygen to maintain O2 saturation; back at home supplemental O2 level  -Transitions of care consult for oxygen  -Continue pharmacy substitution of Spiriva and albuterol inhaler as needed  Macrocytic anemia: Hemoglobin 12 on admission  with elevated MCV of 103.  Question possibility of nutrient deficiency. Vit B12 normal.  -folate pending   Thrombocytopenia: Acute.  Platelet count 147 on admission. CMP unremarkable.   Anxiety: Patient reports being very anxious with everything that is going on with his mother currently.  Reports taking Valium twice daily at home as needed for anxiety. -Continue Valium as needed twice daily  Tobacco abuse: Patient reports quitting smoking tobacco approximately 1 month ago. -Continue to encourage cessation of tobacco use    DVT prophylaxis: Lovenox  Code Status: FULL  Family Communication:   Disposition Plan: Discharge when able to receive home O2.   Consultants:   None   Procedures:   None   Antimicrobials:   None    Subjective: Saw patient at bedside of his dying mother. Patient understandably upset. Reports he got SOB moving from his room to his mother's room.   Review of Systems Otherwise negative except as per HPI, including: General: Denies fever, chills, night sweats or unintended weight loss. Resp: Denies cough, wheezing. Cardiac: Denies chest pain, palpitations, orthopnea, paroxysmal nocturnal dyspnea. GI: Denies abdominal pain, nausea, vomiting, diarrhea or constipation GU: Denies dysuria, frequency, hesitancy or incontinence MS: Denies muscle aches, joint pain or swelling Neuro: Denies headache, neurologic deficits (focal weakness, numbness, tingling), abnormal gait Psych: Denies anxiety, depression, SI/HI/AVH Skin: Denies new rashes or lesions ID: Denies sick contacts, exotic exposures, travel  Objective: Vitals:   03/11/19 0730 03/11/19 0836 03/11/19 2134 03/12/19 0512  BP: 126/88 (!) 141/101 Marland Kitchen)  122/99 (!) 127/91  Pulse: 86 95 (!) 104 91  Resp: (!) 22 15 16 20   Temp:  (!) 97.5 F (36.4 C) 98.6 F (37 C) 97.9 F (36.6 C)  TempSrc:  Oral Oral Oral  SpO2: 97% 100% 97% 100%  Weight:  59 kg    Height:  6\' 1"  (1.854 m)      Intake/Output  Summary (Last 24 hours) at 03/12/2019 0959 Last data filed at 03/12/2019 0514 Gross per 24 hour  Intake 118 ml  Output 200 ml  Net -82 ml   Filed Weights   03/10/19 1105 03/11/19 0836  Weight: 61.2 kg 59 kg    Examination:  General exam: Appears calm and comfortable  Respiratory system: Clear to auscultation. On . Initially patient was breathing harder, but slowed to normal respiratory rate during exam. No accessory muscle use.  Cardiovascular system: S1 & S2 heard, RRR. No JVD, murmurs, rubs, gallops or clicks. No pedal edema. Gastrointestinal system: Abdomen is nondistended, soft and nontender. No organomegaly or masses felt. Normal bowel sounds heard. Central nervous system: Alert and oriented. No focal neurological deficits. Extremities: Symmetric 5 x 5 power. Skin: No rashes, lesions or ulcers Psychiatry: Judgement and insight appear normal. Anxious and tearful.     Data Reviewed:   CBC: Recent Labs  Lab 03/10/19 1859  WBC 4.2  NEUTROABS 2.5  HGB 12.0*  HCT 38.2*  MCV 103.0*  PLT 941*   Basic Metabolic Panel: Recent Labs  Lab 03/10/19 1859 03/11/19 0947  NA 144 143  K 3.6 4.1  CL 102 102  CO2 37* 33*  GLUCOSE 79 116*  BUN 19 13  CREATININE 0.70 0.85  CALCIUM 9.2 9.1   GFR: Estimated Creatinine Clearance: 75.2 mL/min (by C-G formula based on SCr of 0.85 mg/dL). Liver Function Tests: Recent Labs  Lab 03/11/19 0947  AST 30  ALT 26  ALKPHOS 58  BILITOT 1.0  PROT 6.4*  ALBUMIN 3.9   No results for input(s): LIPASE, AMYLASE in the last 168 hours. No results for input(s): AMMONIA in the last 168 hours. Coagulation Profile: No results for input(s): INR, PROTIME in the last 168 hours. Cardiac Enzymes: No results for input(s): CKTOTAL, CKMB, CKMBINDEX, TROPONINI in the last 168 hours. BNP (last 3 results) No results for input(s): PROBNP in the last 8760 hours. HbA1C: No results for input(s): HGBA1C in the last 72 hours. CBG: No results for  input(s): GLUCAP in the last 168 hours. Lipid Profile: No results for input(s): CHOL, HDL, LDLCALC, TRIG, CHOLHDL, LDLDIRECT in the last 72 hours. Thyroid Function Tests: No results for input(s): TSH, T4TOTAL, FREET4, T3FREE, THYROIDAB in the last 72 hours. Anemia Panel: Recent Labs    03/11/19 0947  VITAMINB12 826   Sepsis Labs: No results for input(s): PROCALCITON, LATICACIDVEN in the last 168 hours.  Recent Results (from the past 240 hour(s))  SARS Coronavirus 2 by RT PCR (hospital order, performed in [facility] hospital lab) Nasopharyngeal Nasopharyngeal Swab     Status: None   Collection Time: 03/10/19  6:59 PM   Specimen: Nasopharyngeal Swab  Result Value Ref Range Status   SARS Coronavirus 2 NEGATIVE NEGATIVE Final    Comment: Performed at [facility], [address], [place], [place] [address]         Radiology Studies: DG Chest Portable 1 View  Result Date: 03/10/2019 CLINICAL DATA:  64 year old male with shortness of breath. EXAM: PORTABLE CHEST 1 VIEW COMPARISON:  None. FINDINGS: There is  severe emphysema with hyperexpansion of the lungs. No focal consolidation, pleural effusion, or pneumothorax. The cardiac silhouette is within normal limits. No acute osseous pathology. IMPRESSION: 1. No acute cardiopulmonary process. 2. Severe emphysema. Electronically Signed   By: Elgie Collard M.D.   On: 03/10/2019 18:21        Scheduled Meds: . enoxaparin (LOVENOX) injection  40 mg Subcutaneous Q24H  . nicotine  21 mg Transdermal Daily  . umeclidinium bromide  1 puff Inhalation Daily   Continuous Infusions:   LOS: 1 day   Time spent= 20 mins    De Hollingshead, DO [facility]  If 7PM-7AM, please contact night-coverage  03/12/2019, 9:59 AM

## 2019-03-13 MED ORDER — CYCLOBENZAPRINE HCL 10 MG PO TABS
10.0000 mg | ORAL_TABLET | Freq: Three times a day (TID) | ORAL | Status: DC | PRN
  Administered 2019-03-13 – 2019-03-14 (×3): 10 mg via ORAL
  Filled 2019-03-13 (×3): qty 1

## 2019-03-13 MED ORDER — DOCUSATE SODIUM 100 MG PO CAPS
200.0000 mg | ORAL_CAPSULE | Freq: Every day | ORAL | Status: DC
  Administered 2019-03-13: 200 mg via ORAL
  Filled 2019-03-13: qty 2

## 2019-03-13 MED ORDER — DIAZEPAM 5 MG PO TABS
5.0000 mg | ORAL_TABLET | Freq: Three times a day (TID) | ORAL | Status: DC | PRN
  Administered 2019-03-13 – 2019-03-14 (×3): 5 mg via ORAL
  Filled 2019-03-13 (×3): qty 1

## 2019-03-13 MED ORDER — POLYETHYLENE GLYCOL 3350 17 G PO PACK
17.0000 g | PACK | Freq: Every day | ORAL | Status: DC | PRN
  Administered 2019-03-14: 17 g via ORAL
  Filled 2019-03-13: qty 1

## 2019-03-13 NOTE — Care Management (Signed)
Spoke to patient's wife Aurelius Gildersleeve [phone]. She spoke with patient's oxygen supplier this morning. [employer], will UPS patient's plug in oxygen device over night tonight, it will arrive at patient's niece's address tomorrow. MD aware.  Ronny Flurry RN

## 2019-03-13 NOTE — Progress Notes (Signed)
PROGRESS NOTE    Steven Kerr  FIE:[id number] DOB: 05/22/1955 DOA: 03/10/2019 PCP: System, Pcp Not In From [place]   Brief Narrative:   Steven Kerr a 64 y.o.malewith medical history significant ofCOPD on2L of oxygen at baselineand tobacco abuse. He presents with complaints of shortness of breath. Patient had traveled from [place] here after being told that his mother was dying. He was unable to bring enough oxygen canisters with him and ran out of his medications like Spiriva and Valium.  Upon admission to the emergency department patient was noted to beafebrile, tachycardic, and tachypneic withO2 saturations as low as 71% on room air. O2 saturations improvedonce placed backon home 4 L.Labs significant for hemoglobin 12, platelet count 147, and CO2 37. COVID-19 screening was negative. Chest x-ray noted severe emphysema without signs of infiltrate. Patient was given Valium and Tylenol. [facility] called to admit as they were unable to set the patient up with home oxygen at med center.  Assessment & Plan:   Principal Problem:   Acute on chronic respiratory failure with hypoxia (HCC) Active Problems:   Macrocytic anemia   Thrombocytopenia (HCC)   Anxiety   Tobacco abuse   COPD without exacerbation (HCC)   Oxygen dependent  Acute on chronic chronic respiratory failurewith hypoxia COPD/emphysema: Patient presents with complaints of shortness of breath after running out of oxygen while traveling from [place].Normally patient on 2 L clean. Chest x-ray noted severe emphysema. Patient has poor overall air movement, but does not appear to be having an acute flare. Social work consulted and noted that itwill likely be 1/25 before able to arrange for oxygen to be deliveredto patient. -Continuous nasal cannula oxygen to maintain O2 saturation; back at home supplemental O2 level  -Transitions of care consult for oxygen -Continuepharmacy substitution ofSpiriva and albuterol  inhaler as needed  Macrocytic anemia: Hemoglobin 12 on admission with elevated MCV of 103. Question possibility of nutrient deficiency. Vit B12 normal.  -folate pending   Thrombocytopenia: Acute. Platelet count 147 on admission. CMP unremarkable.   Anxiety: Patient reports being very anxious with everything that is going on with his mother currently.Reports taking Valium twice daily at home as needed for anxiety. -Continue Valium as needed up to 3x/day daily  Tobacco abuse: Patient reports quitting smokingtobacco approximately1 month ago. -Continue to encourage cessation of tobacco use    DVT prophylaxis: Lovenox SQ Code Status: Full code  Family Communication: patient, sister in patient's mother's room Disposition Plan: Home when Oxygen arrives--due tomorrow to niece's home   Consultants:   None  Procedures:  None   Antimicrobials: Anti-infectives (From admission, onward)   None       Subjective: Reports pain in back. Has had tylenol and toradol. Has screws there and wants something for pain. Also would like more O2, though as I was checking, I thought the tank might be running low.  Objective: Vitals:   03/12/19 1744 03/12/19 2001 03/13/19 0441 03/13/19 0830  BP:  125/86 117/83   Pulse: 94 (!) 103 92 92  Resp:  20 18 16   Temp:  (!) 97.5 F (36.4 C) 98 F (36.7 C)   TempSrc:  Oral Oral   SpO2: 100% 100% 100% 94%  Weight:      Height:        Intake/Output Summary (Last 24 hours) at 03/13/2019 1126 Last data filed at 03/13/2019 0918 Gross per 24 hour  Intake 818 ml  Output 1200 ml  Net -382 ml   03/15/2019  03/10/19 1105 03/11/19 0836  Weight: 61.2 kg 59 kg    Examination:  General exam: Appears calm and comfortable  Respiratory system: Clear to auscultation. Increased WOB with talking Cardiovascular system: S1 & S2 heard, RRR.  Gastrointestinal system: Abdomen is nondistended, soft and nontender.  Central nervous system: Alert and  oriented. No focal neurological deficits. Extremities: Symmetric  Skin: No rashes Psychiatry: Judgement and insight appear normal. Mood & affect appropriate.    Data Reviewed: I have personally reviewed following labs and imaging studies  CBC: Recent Labs  Lab 03/10/19 1859  WBC 4.2  NEUTROABS 2.5  HGB 12.0*  HCT 38.2*  MCV 103.0*  PLT 425*   Basic Metabolic Panel: Recent Labs  Lab 03/10/19 1859 03/11/19 0947  NA 144 143  K 3.6 4.1  CL 102 102  CO2 37* 33*  GLUCOSE 79 116*  BUN 19 13  CREATININE 0.70 0.85  CALCIUM 9.2 9.1   GFR: Estimated Creatinine Clearance: 75.2 mL/min (by C-G formula based on SCr of 0.85 mg/dL). Liver Function Tests: Recent Labs  Lab 03/11/19 0947  AST 30  ALT 26  ALKPHOS 58  BILITOT 1.0  PROT 6.4*  ALBUMIN 3.9   Anemia Panel: Recent Labs    03/11/19 0947  VITAMINB12 826   Sepsis Labs: No results for input(s): PROCALCITON, LATICACIDVEN in the last 168 hours.  Recent Results (from the past 240 hour(s))  SARS Coronavirus 2 by RT PCR (hospital order, performed in [facility] hospital lab) Nasopharyngeal Nasopharyngeal Swab     Status: None   Collection Time: 03/10/19  6:59 PM   Specimen: Nasopharyngeal Swab  Result Value Ref Range Status   SARS Coronavirus 2 NEGATIVE NEGATIVE Final    Comment: Performed at [facility], [address], [place], [place] [address]      Radiology Studies: No results found.   Scheduled Meds: . enoxaparin (LOVENOX) injection  40 mg Subcutaneous Q24H  . nicotine  21 mg Transdermal Daily  . umeclidinium bromide  1 puff Inhalation Daily   Continuous Infusions:   LOS: 2 days    Donnamae Jude, MD 03/13/2019 11:26 AM [phone] [facility] If 7PM-7AM, please contact night-coverage 03/13/2019, 11:26 AM

## 2019-03-14 LAB — FOLATE RBC
Folate, Hemolysate: 305 ng/mL
Folate, RBC: 859 ng/mL (ref >498)
Hematocrit: 35.5 % — ABNORMAL LOW (ref 37.5–51.0)

## 2019-03-14 MED ORDER — BUDESONIDE-FORMOTEROL FUMARATE 160-4.5 MCG/ACT IN AERO: 2.0000 | INHALATION_SPRAY | Freq: Two times a day (BID) | RESPIRATORY_TRACT | 0 refills | Status: DC

## 2019-03-14 MED ORDER — TIOTROPIUM BROMIDE MONOHYDRATE 18 MCG IN CAPS: 18.0000 ug | ORAL_CAPSULE | Freq: Every day | RESPIRATORY_TRACT | 1 refills | Status: DC

## 2019-03-14 MED ORDER — LACTULOSE 10 GM/15ML PO SOLN
30.0000 g | Freq: Every day | ORAL | Status: DC
  Administered 2019-03-14: 30 g via ORAL
  Filled 2019-03-14: qty 45

## 2019-03-14 MED ORDER — ALBUTEROL SULFATE (2.5 MG/3ML) 0.083% IN NEBU: 2.5000 mg | INHALATION_SOLUTION | Freq: Four times a day (QID) | RESPIRATORY_TRACT | 0 refills | Status: DC | PRN

## 2019-03-14 MED ORDER — TIOTROPIUM BROMIDE MONOHYDRATE 18 MCG IN CAPS: 18.0000 ug | ORAL_CAPSULE | Freq: Every day | RESPIRATORY_TRACT | 0 refills | Status: DC

## 2019-03-14 MED ORDER — TRAZODONE HCL 50 MG PO TABS: 50.0000 mg | ORAL_TABLET | Freq: Every day | ORAL | 0 refills | Status: DC

## 2019-03-14 MED ORDER — ALBUTEROL SULFATE HFA 108 (90 BASE) MCG/ACT IN AERS
2.0000 | INHALATION_SPRAY | Freq: Three times a day (TID) | RESPIRATORY_TRACT | Status: DC
  Administered 2019-03-14: 2 via RESPIRATORY_TRACT
  Filled 2019-03-14: qty 6.7

## 2019-03-14 MED ORDER — PREDNISONE 20 MG PO TABS: 40.0000 mg | ORAL_TABLET | Freq: Every day | ORAL | 0 refills | Status: DC

## 2019-03-14 MED ORDER — TIOTROPIUM BROMIDE MONOHYDRATE 18 MCG IN CAPS: 18.0000 ug | ORAL_CAPSULE | Freq: Every day | RESPIRATORY_TRACT | 0 refills | Status: AC

## 2019-03-14 MED ORDER — DOCUSATE SODIUM 100 MG PO CAPS: 200.0000 mg | ORAL_CAPSULE | Freq: Every day | ORAL | 0 refills | Status: DC

## 2019-03-14 MED ORDER — BISACODYL 10 MG RE SUPP: 10.0000 mg | Freq: Every day | RECTAL | Status: DC | PRN

## 2019-03-14 MED ORDER — DOCUSATE SODIUM 100 MG PO CAPS: 200.0000 mg | ORAL_CAPSULE | Freq: Every day | ORAL | 0 refills | Status: AC

## 2019-03-14 MED ORDER — DIAZEPAM 5 MG PO TABS: 5.0000 mg | ORAL_TABLET | Freq: Three times a day (TID) | ORAL | 0 refills | Status: AC | PRN

## 2019-03-14 MED ORDER — PREDNISONE 20 MG PO TABS
40.0000 mg | ORAL_TABLET | Freq: Every day | ORAL | Status: DC
  Administered 2019-03-14: 40 mg via ORAL
  Filled 2019-03-14: qty 2

## 2019-03-14 MED ORDER — ALBUTEROL SULFATE (2.5 MG/3ML) 0.083% IN NEBU: 2.5000 mg | INHALATION_SOLUTION | Freq: Four times a day (QID) | RESPIRATORY_TRACT | 0 refills | Status: AC | PRN

## 2019-03-14 MED ORDER — ALBUTEROL SULFATE (2.5 MG/3ML) 0.083% IN NEBU: 2.5000 mg | INHALATION_SOLUTION | Freq: Four times a day (QID) | RESPIRATORY_TRACT | Status: DC

## 2019-03-14 MED ORDER — PREDNISONE 20 MG PO TABS: 40.0000 mg | ORAL_TABLET | Freq: Every day | ORAL | 0 refills | Status: AC

## 2019-03-14 MED ORDER — ALBUTEROL SULFATE HFA 108 (90 BASE) MCG/ACT IN AERS: 2.0000 | INHALATION_SPRAY | Freq: Three times a day (TID) | RESPIRATORY_TRACT | 0 refills | Status: AC

## 2019-03-14 MED ORDER — BUDESONIDE-FORMOTEROL FUMARATE 160-4.5 MCG/ACT IN AERO: 2.0000 | INHALATION_SPRAY | Freq: Two times a day (BID) | RESPIRATORY_TRACT | 0 refills | Status: AC

## 2019-03-14 MED ORDER — SENNA 8.6 MG PO TABS
1.0000 | ORAL_TABLET | Freq: Every day | ORAL | Status: DC
  Administered 2019-03-14: 8.6 mg via ORAL
  Filled 2019-03-14: qty 1

## 2019-03-14 MED ORDER — ALBUTEROL SULFATE HFA 108 (90 BASE) MCG/ACT IN AERS: 2.0000 | INHALATION_SPRAY | Freq: Three times a day (TID) | RESPIRATORY_TRACT | 0 refills | Status: DC

## 2019-03-14 MED ORDER — BUDESONIDE-FORMOTEROL FUMARATE 160-4.5 MCG/ACT IN AERO: 2.0000 | INHALATION_SPRAY | Freq: Two times a day (BID) | RESPIRATORY_TRACT | 1 refills | Status: DC

## 2019-03-14 MED ORDER — TRAZODONE HCL 50 MG PO TABS: 50.0000 mg | ORAL_TABLET | Freq: Every day | ORAL | 0 refills | Status: AC

## 2019-03-14 MED ORDER — ALBUTEROL SULFATE (2.5 MG/3ML) 0.083% IN NEBU: 2.5000 mg | INHALATION_SOLUTION | Freq: Three times a day (TID) | RESPIRATORY_TRACT | Status: DC

## 2019-03-14 MED ORDER — DIAZEPAM 5 MG PO TABS: 5.0000 mg | ORAL_TABLET | Freq: Three times a day (TID) | ORAL | 0 refills | Status: DC | PRN

## 2019-03-14 MED FILL — predniSONE 20 MG TABS: 20 | 5 days supply | Qty: 10 | Fill #0

## 2019-03-14 MED FILL — traZODone HCL 50 MG TABS: 50 | 5 days supply | Qty: 5 | Fill #0

## 2019-03-14 MED FILL — diazePAM 5 MG TABS: 5 | 5 days supply | Qty: 15 | Fill #0

## 2019-03-14 MED FILL — DOK 100 MG CAPS: 100 | 5 days supply | Qty: 10 | Fill #0

## 2019-03-14 NOTE — Plan of Care (Signed)

## 2019-03-14 NOTE — TOC Initial Note (Signed)
Transition of Care [facility]) - Initial/Assessment Note    Patient Details  Name: Steven Kerr MRN: [id number] Date of Birth: March 18, 1955  Transition of Care [facility]) CM/SW Contact:    Steven Kerr, [place] Phone Number: 03/14/2019, 12:28 PM  Clinical Narrative:                 CSW acknowledging multiple consults. CSW called and spoke with pt via room telephone. Pt from [facility]) he came down here when he learned his mother was under comfort measures (she has passed this morning). This Probation officer is aware of pt current oxygen needs- per discussion pt has been told that his oxygen equipment will be sent to his nieces home. Pt states that he was brought down here by his brother in law (who he can't remember his name) and that he will have a ride tomorrow back to [facility]. He states that he cannot discharge to any of the multiple family members listed on the face sheet since "they have children and dont have any room." Pt asking multiple times to stay the night here at the hospital. CSW discussed that we would need to ensure oxygen equipment has arrived and then if he is medically stable but stays additional time at the hospital that pt may receive a bill for time stayed at hospital. Pt states understanding, still fixated on being able to stay this evening.   CSW also acknowledged pt request for Medicaid enrollment assistance. TOC team does not enroll individuals in health insurance plans, CSW discussed that pt would need to be screened for eligibility and apply for Medicaid through his home department of social services. CSW will provide that information in pt discharge packet for [facility] office.   CSW also discussed case with RNCM. MD Dr. Tyrell Antonio was alerted to this writers conversation and barriers to discharge as well.   Expected Discharge Plan: Home/Self Care Barriers to Discharge: Equipment Delay   Patient Goals and CMS Choice Patient states their goals for this hospitalization and ongoing  recovery are:: to get home CMS Medicare.gov Compare Post Acute Care list provided to:: (n/a)    Expected Discharge Plan and Services Expected Discharge Plan: Home/Self Care In-house Referral: Clinical Social Work Discharge Planning Services: CM Consult, DC out of service area Post Acute Care Choice: NA Living arrangements for the past 2 months: Apartment   Prior Living Arrangements/Services Living arrangements for the past 2 months: Apartment Lives with:: Spouse Patient language and need for interpreter reviewed:: Yes(no needs) Do you feel safe going back to the place where you live?: Yes      Need for Family Participation in Patient Care: Yes (Comment)(assistance as needed) Care giver support system in place?: Yes (comment)(pt spouse) Current home services: DME Criminal Activity/Legal Involvement Pertinent to Current Situation/Hospitalization: No - Comment as needed   Permission Sought/Granted Permission sought to share information with : Family Supports Permission granted to share information with : Yes, Verbal Permission Granted  Share Information with NAME: Steven Kerr     Permission granted to share info w Relationship: wife  Permission granted to share info w Contact Information: [phone]  Emotional Assessment Appearance:: Other (Comment Required(telephonic assessment) Attitude/Demeanor/Rapport: Engaged(telephonic assessment) Affect (typically observed): Frustrated(telephonic assessment) Orientation: : Oriented to Self, Oriented to Place, Oriented to  Time, Oriented to Situation Alcohol / Substance Use: Not Applicable Psych Involvement: No (comment)  Admission diagnosis:  Oxygen dependent [Z99.81] Hypoxia [R09.02] COPD without exacerbation ([place]) [J44.9] Acute on chronic respiratory failure with hypoxia ([place]) [J96.21] Patient  Active Problem List   Diagnosis Date Noted  . COPD without exacerbation (HCC)   . Oxygen dependent   . Macrocytic anemia 03/11/2019  .  Thrombocytopenia (HCC) 03/11/2019  . Anxiety 03/11/2019  . Tobacco abuse 03/11/2019  . Acute on chronic respiratory failure with hypoxia (HCC) 03/10/2019   PCP:  System, Pcp Not In Pharmacy:   [facility] - [place], [place] - [address] [place] [address] Phone: [phone] Fax: [phone]  [id number] - [place], [place] - [address] [facility] [address] [place] [address] Phone: [phone] Fax: [phone]   Readmission Risk Interventions Readmission Risk Prevention Plan 03/14/2019  Post Dischage Appt Not Complete  Medication Screening Complete  Transportation Screening Complete

## 2019-03-14 NOTE — Discharge Summary (Signed)
Physician Discharge Summary  Steven Kerr JRP:[id number] DOB: 12/05/1955 DOA: 03/10/2019  PCP: System, Pcp Not In  Admit date: 03/10/2019 Discharge date: 03/14/2019  Admitted From: Home  Disposition:  Home   Recommendations for Outpatient Follow-up:  1. Follow up with PCP in 1-2 weeks 2. Please obtain BMP/CBC in one week 3. Needs to follow up with pulmonologist   Home Health: none  Discharge Condition: Stable.  CODE STATUS: full code Diet recommendation: Heart Healthy   Brief/Interim Summary: 64 year old with past medical history significant for COPD on chronic 2 L oxygen at baseline, tobacco abuse.  Who presents complaining of shortness of breath.  Patient had traveled from [place] here after being told that his mother was dying.  He was not able to bring enough oxygen canister with him and run out of his medications like his Spiriva and Valium and oxygen. On admission patient was afebrile, tachycardic, tachypneic and oxygen saturation as low in the 70% on room air.  Oxygen improved after he was placed back on 4 L.  COVID-19 negative.  Chest x-ray noted severe emphysema without signs of infiltrate.  1-acute on chronic respiratory failure with hypoxemia secondary to COPD emphysema Patient ran out of oxygen, patient from out of town. Awaiting home oxygen delivery. Continue with pharmacy substitution for Spiriva and albuterol. We will add prednisone taper dose for a few days.  Needs follow up with pulmonologist   Macrocytic anemia: B12 normal Folic acid pending/  Thrombocytopenia; mild Anxiety; Continue with Valium. Run out of medication. Will give 5 days treatment,  until he is able to get to [place] Tobacco abuse: Counseled Constipation: Scheduled bowel regimen. Had BM  Discharge Diagnoses:  Principal Problem:   Acute on chronic respiratory failure with hypoxia (HCC) Active Problems:   Macrocytic anemia   Thrombocytopenia (HCC)   Anxiety   Tobacco abuse   COPD without  exacerbation (HCC)   Oxygen dependent    Discharge Instructions  Discharge Instructions    Diet - low sodium heart healthy   Complete by: As directed    Increase activity slowly   Complete by: As directed      Allergies as of 03/14/2019   No Known Allergies     Medication List    TAKE these medications   albuterol 108 (90 Base) MCG/ACT inhaler Commonly known as: VENTOLIN HFA Inhale 2 puffs into the lungs 3 (three) times daily.   albuterol (2.5 MG/3ML) 0.083% nebulizer solution Commonly known as: PROVENTIL Take 3 mLs (2.5 mg total) by nebulization every 6 (six) hours as needed for wheezing or shortness of breath.   budesonide-formoterol 160-4.5 MCG/ACT inhaler Commonly known as: SYMBICORT Inhale 2 puffs into the lungs 2 (two) times daily. What changed: when to take this   diazepam 5 MG tablet Commonly known as: VALIUM Take 1 tablet (5 mg total) by mouth every 8 (eight) hours as needed for anxiety. What changed:   medication strength  how much to take  when to take this   docusate sodium 100 MG capsule Commonly known as: COLACE Take 2 capsules (200 mg total) by mouth at bedtime.   nicotine 21 mg/24hr patch Commonly known as: NICODERM CQ - dosed in mg/24 hours Place 21 mg onto the skin daily.   OXYGEN Inhale 2 L/min into the lungs continuous.   predniSONE 20 MG tablet Commonly known as: DELTASONE Take 2 tablets (40 mg total) by mouth daily with breakfast. Start taking on: March 15, 2019   tiotropium 18 MCG inhalation capsule  Commonly known as: SPIRIVA Place 1 capsule (18 mcg total) into inhaler and inhale daily. What changed: when to take this   traZODone 50 MG tablet Commonly known as: DESYREL Take 1 tablet (50 mg total) by mouth at bedtime.            Durable Medical Equipment  (From admission, onward)         Start     Ordered   03/10/19 1550  For home use only DME oxygen  Once    Comments: COPD, POC if qualifies  Question Answer  Comment  Length of Need Lifetime   Mode or (Route) Nasal cannula   Liters per Minute 2   Frequency Continuous (stationary and portable oxygen unit needed)   Oxygen conserving device Yes   Oxygen delivery system Gas      03/10/19 1549          No Known Allergies  Consultations: none  Procedures/Studies: DG Chest Portable 1 View  Result Date: 03/10/2019 CLINICAL DATA:  64 year old male with shortness of breath. EXAM: PORTABLE CHEST 1 VIEW COMPARISON:  None. FINDINGS: There is severe emphysema with hyperexpansion of the lungs. No focal consolidation, pleural effusion, or pneumothorax. The cardiac silhouette is within normal limits. No acute osseous pathology. IMPRESSION: 1. No acute cardiopulmonary process. 2. Severe emphysema. Electronically Signed   By: Anner Crete M.D.   On: 03/10/2019 18:21      Subjective: He report chronic stable dyspnea on exertion   Discharge Exam: Vitals:   03/14/19 0834 03/14/19 1336  BP:  124/88  Pulse:  89  Resp:  18  Temp:  98.1 F (36.7 C)  SpO2: 97% 100%     General: Pt is alert, awake, not in acute distress Cardiovascular: RRR, S1/S2 +, no rubs, no gallops Respiratory: CTA bilaterally, no wheezing, no rhonchi Abdominal: Soft, NT, ND, bowel sounds + Extremities: no edema, no cyanosis    The results of significant diagnostics from this hospitalization (including imaging, microbiology, ancillary and laboratory) are listed below for reference.     Microbiology: Recent Results (from the past 240 hour(s))  SARS Coronavirus 2 by RT PCR (hospital order, performed in [facility] hospital lab) Nasopharyngeal Nasopharyngeal Swab     Status: None   Collection Time: 03/10/19  6:59 PM   Specimen: Nasopharyngeal Swab  Result Value Ref Range Status   SARS Coronavirus 2 NEGATIVE NEGATIVE Final    Comment: Performed at [facility], [address] [place], [place] [address]     Labs: BNP (last 3 results) No results for  input(s): BNP in the last 8760 hours. Basic Metabolic Panel: Recent Labs  Lab 03/10/19 1859 03/11/19 0947  NA 144 143  K 3.6 4.1  CL 102 102  CO2 37* 33*  GLUCOSE 79 116*  BUN 19 13  CREATININE 0.70 0.85  CALCIUM 9.2 9.1   Liver Function Tests: Recent Labs  Lab 03/11/19 0947  AST 30  ALT 26  ALKPHOS 58  BILITOT 1.0  PROT 6.4*  ALBUMIN 3.9   No results for input(s): LIPASE, AMYLASE in the last 168 hours. No results for input(s): AMMONIA in the last 168 hours. CBC: Recent Labs  Lab 03/10/19 1859  WBC 4.2  NEUTROABS 2.5  HGB 12.0*  HCT 38.2*  MCV 103.0*  PLT 147*   Cardiac Enzymes: No results for input(s): CKTOTAL, CKMB, CKMBINDEX, TROPONINI in the last 168 hours. BNP: Invalid input(s): POCBNP CBG: No results for input(s): GLUCAP in the last  168 hours. D-Dimer No results for input(s): DDIMER in the last 72 hours. Hgb A1c No results for input(s): HGBA1C in the last 72 hours. Lipid Profile No results for input(s): CHOL, HDL, LDLCALC, TRIG, CHOLHDL, LDLDIRECT in the last 72 hours. Thyroid function studies No results for input(s): TSH, T4TOTAL, T3FREE, THYROIDAB in the last 72 hours.  Invalid input(s): FREET3 Anemia work up No results for input(s): VITAMINB12, FOLATE, FERRITIN, TIBC, IRON, RETICCTPCT in the last 72 hours. Urinalysis No results found for: COLORURINE, APPEARANCEUR, LABSPEC, PHURINE, GLUCOSEU, HGBUR, BILIRUBINUR, KETONESUR, PROTEINUR, UROBILINOGEN, NITRITE, LEUKOCYTESUR Sepsis Labs Invalid input(s): PROCALCITONIN,  WBC,  LACTICIDVEN Microbiology Recent Results (from the past 240 hour(s))  SARS Coronavirus 2 by RT PCR (hospital order, performed in [facility] hospital lab) Nasopharyngeal Nasopharyngeal Swab     Status: None   Collection Time: 03/10/19  6:59 PM   Specimen: Nasopharyngeal Swab  Result Value Ref Range Status   SARS Coronavirus 2 NEGATIVE NEGATIVE Final    Comment: Performed at [facility], [address] [place], [place] [address]     Time coordinating discharge: 40 minutes  SIGNED:   Alba Cory, MD  [facility]

## 2019-03-14 NOTE — Care Management (Signed)
Prescriptions sent to [facility], patient does not have money for co pays. NCM paid co pays with petty cash.   Ronny Flurry RN

## 2019-03-14 NOTE — Progress Notes (Addendum)
Patient discharged to home. Verbalizes understanding of all discharge instructions and discharge medications. Medications called into TOC. Awaiting arrival to room. Patient's niece brought the portable O2 concentrator for patient to travel home with. Reminded patient should he be in breathing distress at anytime on his travels home to call 911. Also informed patient and patient's niece should have questions regarding the portable concentrator they could contact the oxygen supplier and they should be able to assist them via phone. Patient transported home via family car.

## 2019-03-14 NOTE — Progress Notes (Signed)
PROGRESS NOTE    Steven Kerr  HBZ:[id number] DOB: 1955/03/19 DOA: 03/10/2019 PCP: System, Pcp Not In   Brief Narrative: 64 year old with past medical history significant for COPD on chronic 2 L oxygen at baseline, tobacco abuse.  Who presents complaining of shortness of breath.  Patient had traveled from [place] here after being told that his mother was dying.  Steven Kerr was not able to bring enough oxygen canister with him and run out of his medications like his Spiriva and Valium and oxygen. On admission patient was afebrile, tachycardic, tachypneic and oxygen saturation as low in the 70% on room air.  Oxygen improved after Steven Kerr was placed back on 4 L.  COVID-19 negative.  Chest x-ray noted severe emphysema without signs of infiltrate.  Assessment & Plan:   Principal Problem:   Acute on chronic respiratory failure with hypoxia (HCC) Active Problems:   Macrocytic anemia   Thrombocytopenia (HCC)   Anxiety   Tobacco abuse   COPD without exacerbation (HCC)   Oxygen dependent  1-acute on chronic respiratory failure with hypoxemia secondary to COPD emphysema Patient ran out of oxygen, patient from out of town. Awaiting home oxygen delivery. Continue with pharmacy substitution for Spiriva and albuterol. We will add prednisone taper dose for a few days  Macrocytic anemia: B01 normal Folic acid pending/  Thrombocytopenia; mild Anxiety; Continue with Valium. Tobacco abuse: Counseled Constipation: Scheduled bowel regimen  Estimated body mass index is 17.15 kg/m as calculated from the following:   Height as of this encounter: 6\' 1"  (1.854 m).   Weight as of this encounter: 59 kg.   DVT prophylaxis: Lovenox Code Status: full code Family Communication: care discussed with patient Disposition Plan: Discharge home in 24 hours, awaiting home oxygen delivery  Consultants:   none  Procedures:   none  Antimicrobials:  none  Subjective: Steven Kerr report SOB on exertion, limit his function  activities for months now.    Objective: Vitals:   03/13/19 1500 03/13/19 2216 03/14/19 0502 03/14/19 0834  BP: 118/80 (!) 133/91 109/86   Pulse: 86 89 93   Resp: 18 19 18    Temp: 98.2 F (36.8 C) 98.4 F (36.9 C) 98.3 F (36.8 C)   TempSrc: Oral Oral Oral   SpO2:  100% 100% 97%  Weight:      Height:        Intake/Output Summary (Last 24 hours) at 03/14/2019 1238 Last data filed at 03/14/2019 0856 Gross per 24 hour  Intake 758 ml  Output 275 ml  Net 483 ml   Filed Weights   03/10/19 1105 03/11/19 0836  Weight: 61.2 kg 59 kg    Examination:  General exam: Appears calm and comfortable  Respiratory system: Clear to auscultation. Respiratory effort normal. Cardiovascular system: S1 & S2 heard, RRR. No JVD, murmurs, rubs, gallops or clicks. No pedal edema. Gastrointestinal system: Abdomen is nondistended, soft and nontender. No organomegaly or masses felt. Normal bowel sounds heard. Central nervous system: Alert and oriented. No focal neurological deficits. Extremities: Symmetric 5 x 5 power. Skin: No rashes, lesions or ulcers    Data Reviewed: I have personally reviewed following labs and imaging studies  CBC: Recent Labs  Lab 03/10/19 1859  WBC 4.2  NEUTROABS 2.5  HGB 12.0*  HCT 38.2*  MCV 103.0*  PLT 751*   Basic Metabolic Panel: Recent Labs  Lab 03/10/19 1859 03/11/19 0947  NA 144 143  K 3.6 4.1  CL 102 102  CO2 37* 33*  GLUCOSE 79 116*  BUN 19 13  CREATININE 0.70 0.85  CALCIUM 9.2 9.1   GFR: Estimated Creatinine Clearance: 75.2 mL/min (by C-G formula based on SCr of 0.85 mg/dL). Liver Function Tests: Recent Labs  Lab 03/11/19 0947  AST 30  ALT 26  ALKPHOS 58  BILITOT 1.0  PROT 6.4*  ALBUMIN 3.9   No results for input(s): LIPASE, AMYLASE in the last 168 hours. No results for input(s): AMMONIA in the last 168 hours. Coagulation Profile: No results for input(s): INR, PROTIME in the last 168 hours. Cardiac Enzymes: No results for  input(s): CKTOTAL, CKMB, CKMBINDEX, TROPONINI in the last 168 hours. BNP (last 3 results) No results for input(s): PROBNP in the last 8760 hours. HbA1C: No results for input(s): HGBA1C in the last 72 hours. CBG: No results for input(s): GLUCAP in the last 168 hours. Lipid Profile: No results for input(s): CHOL, HDL, LDLCALC, TRIG, CHOLHDL, LDLDIRECT in the last 72 hours. Thyroid Function Tests: No results for input(s): TSH, T4TOTAL, FREET4, T3FREE, THYROIDAB in the last 72 hours. Anemia Panel: No results for input(s): VITAMINB12, FOLATE, FERRITIN, TIBC, IRON, RETICCTPCT in the last 72 hours. Sepsis Labs: No results for input(s): PROCALCITON, LATICACIDVEN in the last 168 hours.  Recent Results (from the past 240 hour(s))  SARS Coronavirus 2 by RT PCR (hospital order, performed in [facility] hospital lab) Nasopharyngeal Nasopharyngeal Swab     Status: None   Collection Time: 03/10/19  6:59 PM   Specimen: Nasopharyngeal Swab  Result Value Ref Range Status   SARS Coronavirus 2 NEGATIVE NEGATIVE Final    Comment: Performed at [facility], [address] [place], [place] [address]         Radiology Studies: No results found.      Scheduled Meds: . docusate sodium  200 mg Oral QHS  . enoxaparin (LOVENOX) injection  40 mg Subcutaneous Q24H  . lactulose  30 g Oral Daily  . nicotine  21 mg Transdermal Daily  . predniSONE  40 mg Oral Q breakfast  . senna  1 tablet Oral Daily  . umeclidinium bromide  1 puff Inhalation Daily   Continuous Infusions:   LOS: 3 days    Time spent: 35 minutes    Alba Cory, MD [facility]   If 7PM-7AM, please contact night-coverage  03/14/2019, 12:38 PM

## 2021-05-04 IMAGING — DX DG CHEST 1V PORT
2 series · 2 of 2 positions shown · non-contrast
Comparison: None.

CLINICAL DATA: 61-year-old male with shortness of breath.

EXAM:
PORTABLE CHEST 1 VIEW

[chest ap (1 of 2)]
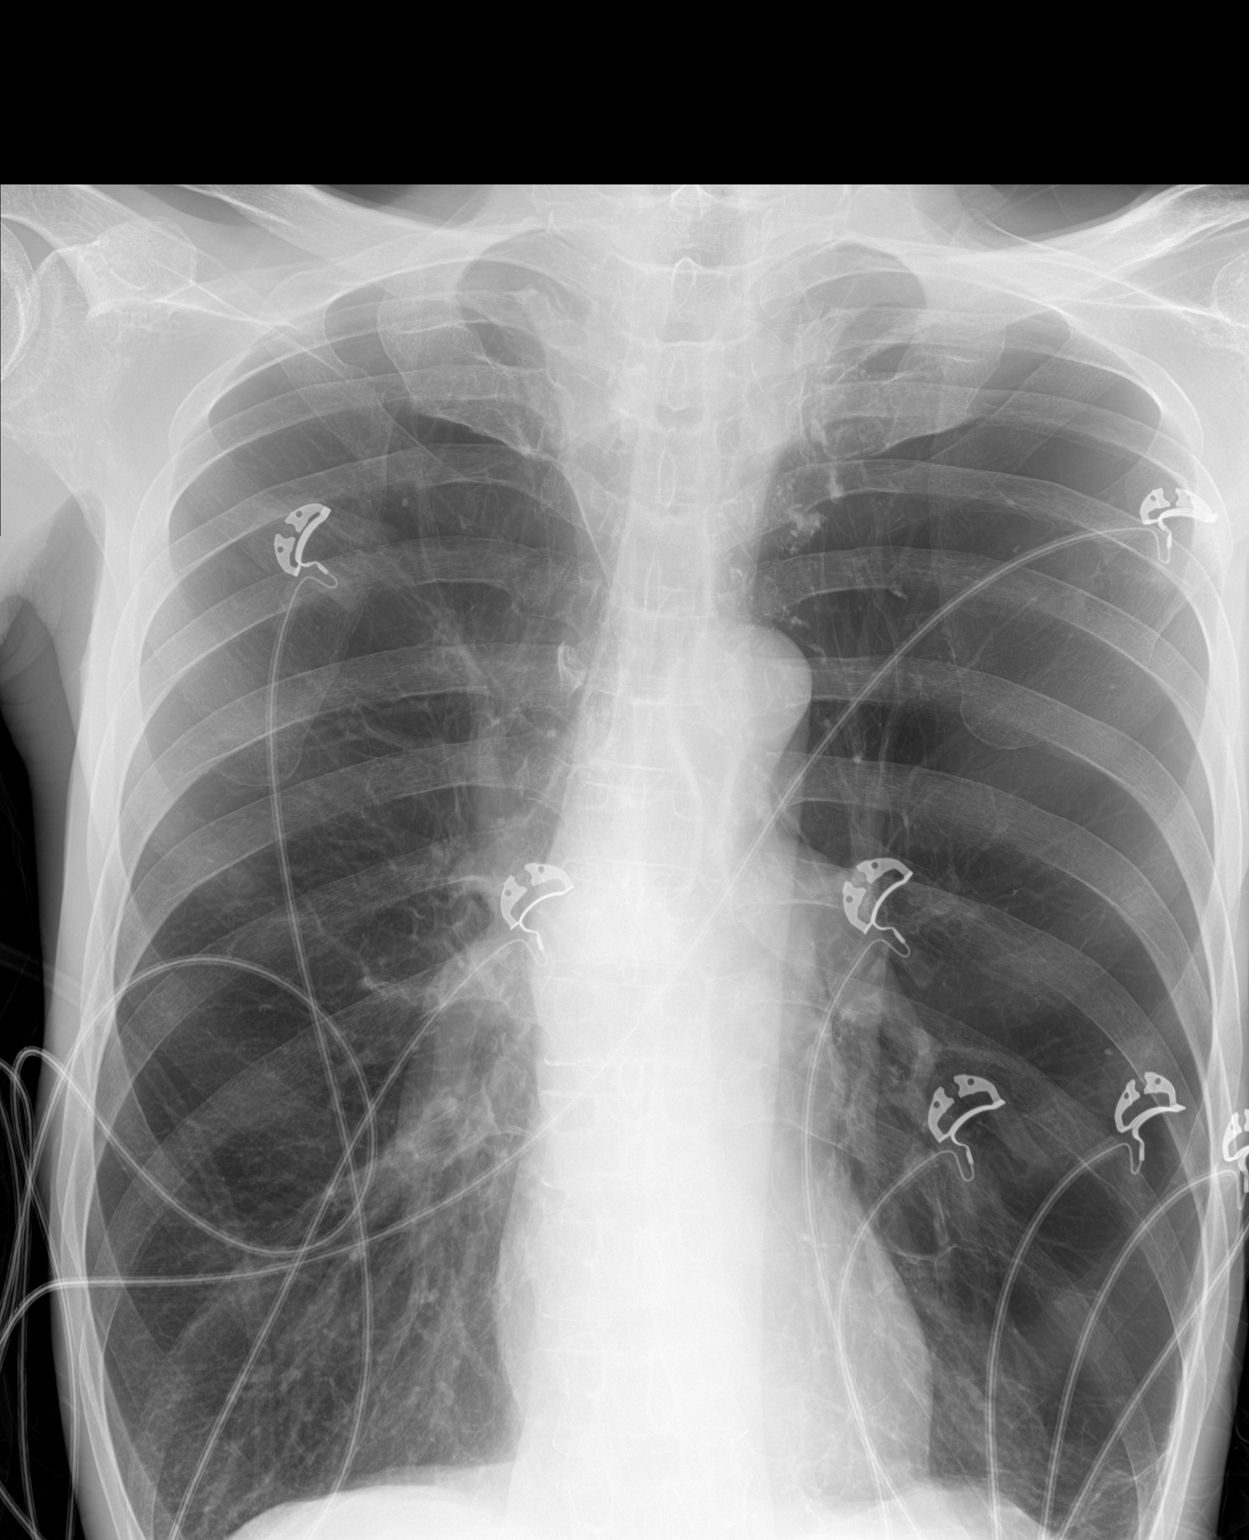

[chest ap (2 of 2)]
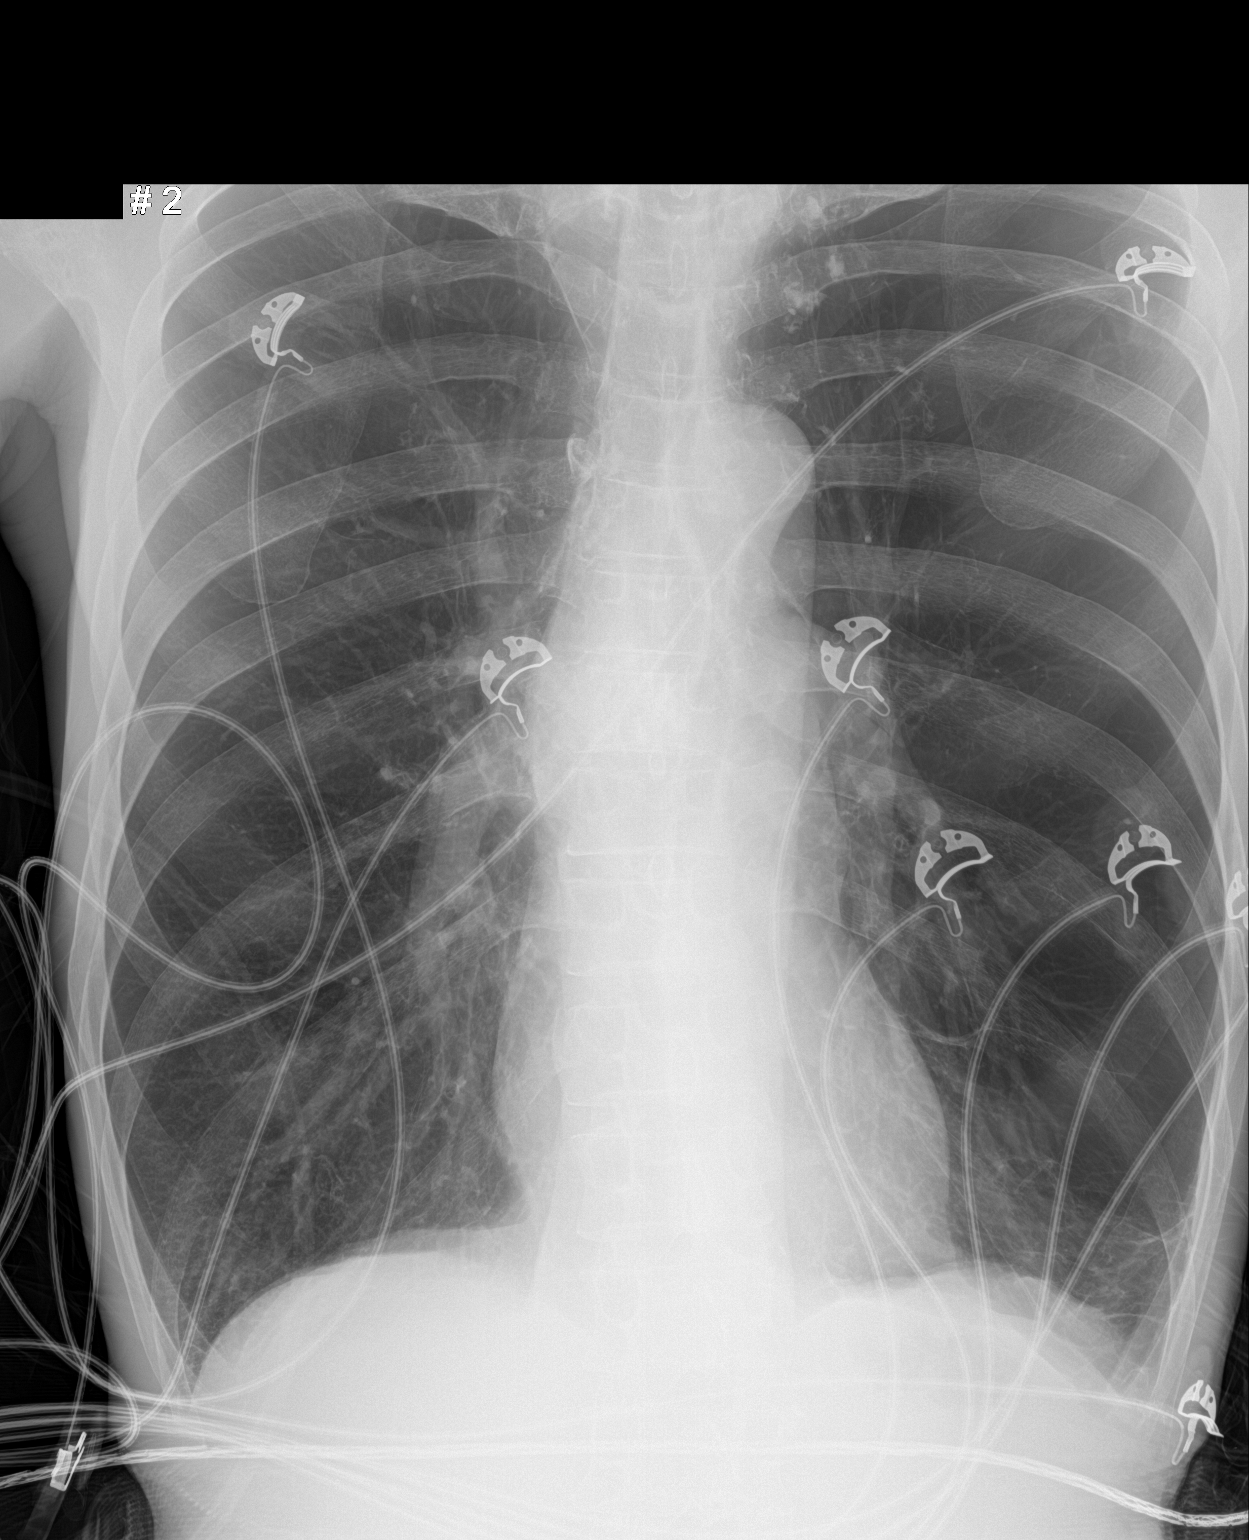

[2 of 2 positions shown; findings below may reference images not displayed]

FINDINGS: There is severe emphysema with hyperexpansion of the lungs. No focal
consolidation, pleural effusion, or pneumothorax. The cardiac
silhouette is within normal limits. No acute osseous pathology.
IMPRESSION: 1. No acute cardiopulmonary process.
2. Severe emphysema.
